# Patient Record
Sex: Female | Born: 1960 | Race: White | Hispanic: No | Marital: Single | Smoking: Never smoker
Health system: Southern US, Community
[De-identification: ages and names within clinical notes are randomized; demographics above are authoritative.]

## PROBLEM LIST (undated history)

## (undated) DIAGNOSIS — E039 Hypothyroidism, unspecified: Secondary | ICD-10-CM

## (undated) HISTORY — PX: TONSILLECTOMY: SUR1361

## (undated) HISTORY — DX: Hypothyroidism, unspecified: E03.9

---

## 1999-09-24 ENCOUNTER — Other Ambulatory Visit: Admission: RE | Admit: 1999-09-24 | Discharge: 1999-09-24 | Payer: Self-pay | Admitting: Obstetrics and Gynecology

## 1999-12-16 ENCOUNTER — Other Ambulatory Visit: Admission: RE | Admit: 1999-12-16 | Discharge: 1999-12-16 | Payer: Self-pay | Admitting: Obstetrics and Gynecology

## 2000-03-08 ENCOUNTER — Other Ambulatory Visit: Admission: RE | Admit: 2000-03-08 | Discharge: 2000-03-08 | Payer: Self-pay | Admitting: Obstetrics and Gynecology

## 2000-09-01 ENCOUNTER — Encounter: Admission: RE | Admit: 2000-09-01 | Discharge: 2000-09-01 | Payer: Self-pay | Admitting: General Surgery

## 2000-09-01 ENCOUNTER — Other Ambulatory Visit: Admission: RE | Admit: 2000-09-01 | Discharge: 2000-09-01 | Payer: Self-pay | Admitting: Obstetrics and Gynecology

## 2000-09-01 ENCOUNTER — Encounter: Payer: Self-pay | Admitting: General Surgery

## 2000-09-01 ENCOUNTER — Other Ambulatory Visit: Admission: RE | Admit: 2000-09-01 | Discharge: 2000-09-01 | Payer: Self-pay | Admitting: General Surgery

## 2000-09-01 ENCOUNTER — Encounter (INDEPENDENT_AMBULATORY_CARE_PROVIDER_SITE_OTHER): Payer: Self-pay | Admitting: *Deleted

## 2002-03-14 ENCOUNTER — Other Ambulatory Visit: Admission: RE | Admit: 2002-03-14 | Discharge: 2002-03-14 | Payer: Self-pay | Admitting: Obstetrics and Gynecology

## 2005-07-15 ENCOUNTER — Ambulatory Visit (HOSPITAL_COMMUNITY): Admission: RE | Admit: 2005-07-15 | Discharge: 2005-07-15 | Payer: Self-pay | Admitting: Obstetrics and Gynecology

## 2005-07-29 ENCOUNTER — Encounter (INDEPENDENT_AMBULATORY_CARE_PROVIDER_SITE_OTHER): Payer: Self-pay | Admitting: Specialist

## 2005-07-29 ENCOUNTER — Encounter: Admission: RE | Admit: 2005-07-29 | Discharge: 2005-07-29 | Payer: Self-pay | Admitting: Obstetrics and Gynecology

## 2005-07-29 ENCOUNTER — Other Ambulatory Visit: Admission: RE | Admit: 2005-07-29 | Discharge: 2005-07-29 | Payer: Self-pay | Admitting: Interventional Radiology

## 2006-01-06 ENCOUNTER — Encounter: Admission: RE | Admit: 2006-01-06 | Discharge: 2006-01-06 | Payer: Self-pay | Admitting: "Endocrinology

## 2007-04-20 ENCOUNTER — Encounter: Admission: RE | Admit: 2007-04-20 | Discharge: 2007-04-20 | Payer: Self-pay | Admitting: Obstetrics and Gynecology

## 2008-06-20 ENCOUNTER — Encounter: Admission: RE | Admit: 2008-06-20 | Discharge: 2008-06-20 | Payer: Self-pay | Admitting: Obstetrics and Gynecology

## 2009-08-07 ENCOUNTER — Encounter: Admission: RE | Admit: 2009-08-07 | Discharge: 2009-08-07 | Payer: Self-pay | Admitting: Obstetrics and Gynecology

## 2010-08-13 ENCOUNTER — Encounter: Admission: RE | Admit: 2010-08-13 | Discharge: 2010-08-13 | Payer: Self-pay | Admitting: Obstetrics and Gynecology

## 2011-07-13 ENCOUNTER — Other Ambulatory Visit: Payer: Self-pay | Admitting: Obstetrics and Gynecology

## 2011-07-13 DIAGNOSIS — Z1231 Encounter for screening mammogram for malignant neoplasm of breast: Secondary | ICD-10-CM

## 2011-09-23 ENCOUNTER — Ambulatory Visit
Admission: RE | Admit: 2011-09-23 | Discharge: 2011-09-23 | Disposition: A | Discharge status: Home / Self Care | Payer: BC Managed Care – PPO | Source: Ambulatory Visit | Attending: Obstetrics and Gynecology | Admitting: Obstetrics and Gynecology

## 2011-09-23 DIAGNOSIS — Z1231 Encounter for screening mammogram for malignant neoplasm of breast: Secondary | ICD-10-CM

## 2012-04-22 ENCOUNTER — Other Ambulatory Visit: Payer: Self-pay | Admitting: Obstetrics and Gynecology

## 2012-04-22 NOTE — Telephone Encounter (Signed)
Spoke with pt rgd rx refill request infromed pt need aex pt has appt 07/06/12 at 920 wit Sr informed pt will give refill to aex per protocol pt voice unserstanding

## 2012-05-25 ENCOUNTER — Telehealth: Payer: Self-pay | Admitting: Obstetrics and Gynecology

## 2012-05-25 NOTE — Telephone Encounter (Signed)
Triage/2nd req/rx

## 2012-05-25 NOTE — Telephone Encounter (Signed)
Lm for pt to call back

## 2012-07-06 ENCOUNTER — Ambulatory Visit: Payer: Self-pay | Admitting: Obstetrics and Gynecology

## 2012-08-02 ENCOUNTER — Ambulatory Visit: Payer: Self-pay | Admitting: Obstetrics and Gynecology

## 2012-08-17 ENCOUNTER — Encounter: Payer: Self-pay | Admitting: Obstetrics and Gynecology

## 2012-08-17 ENCOUNTER — Ambulatory Visit (INDEPENDENT_AMBULATORY_CARE_PROVIDER_SITE_OTHER): Payer: BC Managed Care – PPO | Admitting: Obstetrics and Gynecology

## 2012-08-17 VITALS — BP 110/74 | Ht 68.0 in | Wt 194.0 lb

## 2012-08-17 DIAGNOSIS — Z01419 Encounter for gynecological examination (general) (routine) without abnormal findings: Secondary | ICD-10-CM

## 2012-08-17 DIAGNOSIS — N951 Menopausal and female climacteric states: Secondary | ICD-10-CM

## 2012-08-17 DIAGNOSIS — Z124 Encounter for screening for malignant neoplasm of cervix: Secondary | ICD-10-CM

## 2012-08-17 MED ORDER — ETONOGESTREL-ETHINYL ESTRADIOL 0.12-0.015 MG/24HR VA RING: VAGINAL_RING | VAGINAL | Status: AC

## 2012-08-17 MED ORDER — NYSTATIN-TRIAMCINOLONE 100000-0.1 UNIT/GM-% EX OINT
TOPICAL_OINTMENT | Freq: Three times a day (TID) | CUTANEOUS | Status: AC | PRN
Start: 2012-08-17 — End: ?

## 2012-08-17 NOTE — Progress Notes (Signed)
The patient reports:normal menses, no abnormal bleeding, pelvic pain or discharge  Contraception:NuvaRing vaginal inserts  Last mammogram: was normal October 2012 Last pap: was normal May  2012  GC/Chlamydia cultures offered: declined HIV/RPR/HbsAg offered:  declined HSV 1 and 2 glycoprotein offered: declined  Menstrual cycle regular and monthly: Yes Menstrual flow normal: Yes  Urinary symptoms: none Normal bowel movements: Yes Reports abuse at home: No:  Subjective:    Beth Hill is a 51 y.o. female, No obstetric history on file., who presents for an annual exam.     History   Social History  . Marital Status: Single    Spouse Name: N/A    Number of Children: N/A  . Years of Education: N/A   Social History Main Topics  . Smoking status: Never Smoker   . Smokeless tobacco: Never Used  . Alcohol Use: No  . Drug Use: No  . Sexually Active: Yes    Birth Control/ Protection: Inserts     nuvaring    Other Topics Concern  . None   Social History Narrative  . None    Menstrual cycle:   LMP: Patient's last menstrual period was 07/24/2012.           Cycle: normal  The following portions of the patient's history were reviewed and updated as appropriate: allergies, current medications, past family history, past medical history, past social history, past surgical history and problem list.  Review of Systems Pertinent items are noted in HPI. Breast:Negative for breast lump,nipple discharge or nipple retraction Gastrointestinal: Negative for abdominal pain, change in bowel habits or rectal bleeding Urinary:negative   Objective:    BP 110/74  Ht 5\' 8"  (1.727 m)  Wt 194 lb (87.998 kg)  BMI 29.50 kg/m2  LMP 07/24/2012    Weight:  Wt Readings from Last 1 Encounters:  08/17/12 194 lb (87.998 kg)          BMI: Body mass index is 29.50 kg/(m^2).  General Appearance: Alert, appropriate appearance for age. No acute distress HEENT: Grossly normal Neck / Thyroid:  Supple, no masses, nodes or enlargement Lungs: clear to auscultation bilaterally Back: No CVA tenderness Breast Exam: No masses or nodes.No dimpling, nipple retraction or discharge. Cardiovascular: Regular rate and rhythm. S1, S2, no murmur Gastrointestinal: Soft, non-tender, no masses or organomegaly Pelvic Exam: Vulva and vagina appear normal. Bimanual exam reveals normal uterus and adnexa. Rectovaginal: normal rectal, no masses Lymphatic Exam: Non-palpable nodes in neck, clavicular, axillary, or inguinal regions Skin: no rash or abnormalities Neurologic: Normal gait and speech, no tremor  Psychiatric: Alert and oriented, appropriate affect.    Assessment:    Normal gyn exam Contraceptive management    Plan:    mammogram pap smear return annually or prn STD screening: declined Contraception:NuvaRing vaginal inserts  Will check Day 6 FSH if elevated, will d/c Nuvaring      Freida Busman JMD

## 2012-08-18 ENCOUNTER — Other Ambulatory Visit: Payer: Self-pay | Admitting: Obstetrics and Gynecology

## 2012-08-19 LAB — PAP IG W/ RFLX HPV ASCU

## 2012-08-25 ENCOUNTER — Other Ambulatory Visit: Payer: BC Managed Care – PPO

## 2012-08-26 ENCOUNTER — Other Ambulatory Visit: Payer: BC Managed Care – PPO

## 2012-08-26 DIAGNOSIS — N951 Menopausal and female climacteric states: Secondary | ICD-10-CM

## 2012-09-14 ENCOUNTER — Telehealth: Payer: Self-pay | Admitting: Obstetrics and Gynecology

## 2012-09-14 NOTE — Telephone Encounter (Signed)
TC to pt.  Requesting lab results.  Informed FSH reviewed by DR SR and no further F/U indicated.

## 2012-10-18 ENCOUNTER — Other Ambulatory Visit: Payer: Self-pay | Admitting: Obstetrics and Gynecology

## 2012-10-18 DIAGNOSIS — Z803 Family history of malignant neoplasm of breast: Secondary | ICD-10-CM

## 2012-10-18 DIAGNOSIS — Z1231 Encounter for screening mammogram for malignant neoplasm of breast: Secondary | ICD-10-CM

## 2012-12-28 ENCOUNTER — Ambulatory Visit: Payer: BC Managed Care – PPO

## 2013-01-11 ENCOUNTER — Ambulatory Visit: Payer: BC Managed Care – PPO

## 2013-01-18 ENCOUNTER — Ambulatory Visit
Admission: RE | Admit: 2013-01-18 | Discharge: 2013-01-18 | Disposition: A | Discharge status: Home / Self Care | Payer: BC Managed Care – PPO | Source: Ambulatory Visit | Attending: Obstetrics and Gynecology | Admitting: Obstetrics and Gynecology

## 2013-01-18 DIAGNOSIS — Z1231 Encounter for screening mammogram for malignant neoplasm of breast: Secondary | ICD-10-CM

## 2013-01-18 DIAGNOSIS — Z803 Family history of malignant neoplasm of breast: Secondary | ICD-10-CM

## 2014-04-11 ENCOUNTER — Other Ambulatory Visit: Payer: Self-pay

## 2014-04-11 DIAGNOSIS — Z1231 Encounter for screening mammogram for malignant neoplasm of breast: Secondary | ICD-10-CM

## 2014-05-07 ENCOUNTER — Encounter (INDEPENDENT_AMBULATORY_CARE_PROVIDER_SITE_OTHER): Payer: Self-pay

## 2014-05-07 ENCOUNTER — Ambulatory Visit
Admission: RE | Admit: 2014-05-07 | Discharge: 2014-05-07 | Disposition: A | Discharge status: Home / Self Care | Payer: BC Managed Care – PPO | Source: Ambulatory Visit

## 2014-05-07 DIAGNOSIS — Z1231 Encounter for screening mammogram for malignant neoplasm of breast: Secondary | ICD-10-CM

## 2015-05-08 ENCOUNTER — Other Ambulatory Visit: Payer: Self-pay

## 2015-05-08 DIAGNOSIS — Z1231 Encounter for screening mammogram for malignant neoplasm of breast: Secondary | ICD-10-CM

## 2015-06-05 ENCOUNTER — Ambulatory Visit
Admission: RE | Admit: 2015-06-05 | Discharge: 2015-06-05 | Disposition: A | Discharge status: Home / Self Care | Payer: BLUE CROSS/BLUE SHIELD | Source: Ambulatory Visit

## 2015-06-05 DIAGNOSIS — Z1231 Encounter for screening mammogram for malignant neoplasm of breast: Secondary | ICD-10-CM

## 2015-06-06 ENCOUNTER — Other Ambulatory Visit: Payer: Self-pay | Admitting: Obstetrics and Gynecology

## 2015-06-06 DIAGNOSIS — R928 Other abnormal and inconclusive findings on diagnostic imaging of breast: Secondary | ICD-10-CM

## 2015-06-12 ENCOUNTER — Ambulatory Visit
Admission: RE | Admit: 2015-06-12 | Discharge: 2015-06-12 | Disposition: A | Discharge status: Home / Self Care | Payer: BLUE CROSS/BLUE SHIELD | Source: Ambulatory Visit | Attending: Obstetrics and Gynecology | Admitting: Obstetrics and Gynecology

## 2015-06-12 DIAGNOSIS — R928 Other abnormal and inconclusive findings on diagnostic imaging of breast: Secondary | ICD-10-CM

## 2015-12-16 ENCOUNTER — Other Ambulatory Visit: Payer: Self-pay | Admitting: Obstetrics and Gynecology

## 2015-12-16 DIAGNOSIS — N63 Unspecified lump in unspecified breast: Secondary | ICD-10-CM

## 2016-01-22 ENCOUNTER — Ambulatory Visit
Admission: RE | Admit: 2016-01-22 | Discharge: 2016-01-22 | Disposition: A | Discharge status: Home / Self Care | Payer: BLUE CROSS/BLUE SHIELD | Source: Ambulatory Visit | Attending: Obstetrics and Gynecology | Admitting: Obstetrics and Gynecology

## 2016-01-22 DIAGNOSIS — N63 Unspecified lump in unspecified breast: Secondary | ICD-10-CM

## 2016-06-11 ENCOUNTER — Other Ambulatory Visit: Payer: Self-pay | Admitting: *Deleted

## 2016-06-11 ENCOUNTER — Encounter: Payer: Self-pay | Admitting: Vascular Surgery

## 2016-06-11 DIAGNOSIS — I83893 Varicose veins of bilateral lower extremities with other complications: Secondary | ICD-10-CM

## 2016-06-15 ENCOUNTER — Ambulatory Visit (HOSPITAL_COMMUNITY)
Admission: RE | Admit: 2016-06-15 | Discharge: 2016-06-15 | Disposition: A | Discharge status: Home / Self Care | Payer: BLUE CROSS/BLUE SHIELD | Source: Ambulatory Visit | Attending: Vascular Surgery | Admitting: Vascular Surgery

## 2016-06-15 ENCOUNTER — Ambulatory Visit (INDEPENDENT_AMBULATORY_CARE_PROVIDER_SITE_OTHER): Payer: BLUE CROSS/BLUE SHIELD | Admitting: Vascular Surgery

## 2016-06-15 VITALS — BP 113/76 | HR 77 | Temp 97.6°F | Resp 16 | Ht 67.5 in | Wt 170.0 lb

## 2016-06-15 DIAGNOSIS — I8393 Asymptomatic varicose veins of bilateral lower extremities: Secondary | ICD-10-CM | POA: Diagnosis not present

## 2016-06-15 DIAGNOSIS — I8391 Asymptomatic varicose veins of right lower extremity: Secondary | ICD-10-CM | POA: Diagnosis not present

## 2016-06-15 DIAGNOSIS — I8390 Asymptomatic varicose veins of unspecified lower extremity: Secondary | ICD-10-CM | POA: Insufficient documentation

## 2016-06-15 DIAGNOSIS — I83893 Varicose veins of bilateral lower extremities with other complications: Secondary | ICD-10-CM

## 2016-06-15 NOTE — Progress Notes (Signed)
Subjective:     Patient ID: Beth Hill, female   DOB: [birth year], 55 y.o.   MRN: [id number]  HPI this 55 year old female was self-referred for pain in both lower extremities and a history of sclerotherapy in both lower extremities for spider veins. She denies any history of DVT thrombophlebitis stasis ulcers or bleeding. She denies any bulging varicosities at this time. She does state that her legs ache and throb as the day progresses. 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 She has tried short leg elastic compression stockings with some help.  Past Medical History  Diagnosis Date  . Hypothyroid     Social History  Substance Use Topics  . Smoking status: Never Smoker   . Smokeless tobacco: Never Used  . Alcohol Use: No    Family History  Problem Relation Age of Onset  . Breast cancer Mother   . Lung cancer Mother   . Cancer Mother 51    bone  . Brain cancer Mother   . Thyroid cancer Brother 19  . Cancer Maternal Aunt     vulvar cancer  . Alzheimer's disease Maternal Grandmother   . Emphysema Maternal Grandfather   . Cancer Paternal Grandmother     bone cancer  . Heart failure Paternal Grandfather     No Known Allergies   Current outpatient prescriptions:  .  ALPRAZolam (XANAX) 0.5 MG tablet, Take 0.5 mg by mouth at bedtime as needed., Disp: , Rfl:  .  etonogestrel-ethinyl estradiol (NUVARING) 0.12-0.015 MG/24HR vaginal ring, Insert vaginally and leave in place for 3 consecutive weeks, then remove for 1 week., Disp: 1 each, Rfl: 11 .  ipratropium (ATROVENT) 0.03 % nasal spray, Place 1 spray into both nostrils as needed for rhinitis., Disp: , Rfl:  .  levothyroxine (SYNTHROID, LEVOTHROID) 100 MCG tablet, Take 100 mcg by mouth daily., Disp: , Rfl:  .  liothyronine (CYTOMEL) 5 MCG tablet, Take 5 mcg by mouth daily., Disp: , Rfl:  .  nystatin-triamcinolone ointment (MYCOLOG), Apply topically 3 (three) times daily as needed., Disp: 60 g, Rfl:  3  Filed Vitals:   06/15/16 1007  BP: 113/76  Pulse: 77  Temp: 97.6 F (36.4 C)  Resp: 16  Height: 5' 7.5" (1.715 m)  Weight: 170 lb (77.111 kg)  SpO2: 100%    Body mass index is 26.22 kg/(m^2).           Review of Systems denies chest pain, dyspnea on exertion, does have occasional shortness of breath due to allergies but not on a consistent basis. Denies hemoptysis or claudication.     Objective:   Physical Exam BP 113/76 mmHg  Pulse 77  Temp(Src) 97.6 F (36.4 C)  Resp 16  Ht 5' 7.5" (1.715 m)  Wt 170 lb (77.111 kg)  BMI 26.22 kg/m2  SpO2 100%    Gen.-alert and oriented x3 in no apparent distress HEENT normal for age Lungs no rhonchi or wheezing Cardiovascular regular rhythm no murmurs carotid pulses 3+ palpable no bruits audible Abdomen soft nontender no palpable masses Musculoskeletal free of  major deformities Skin clear -no rashes Neurologic normal Lower extremities 3+ femoral and dorsalis pedis pulses palpable bilaterally with no edema No bulging varicosities noted. Evidence of some small spider veins in the lateral thighs bilaterally.  Today I ordered bilateral venous duplex exam which I reviewed and interpreted. There is no DVT. There is gross reflux in the right great saphenous vein with borderline in  size vein down to the knee. Left great saphenous vein has no reflux.      Assessment:     Bilateral leg discomfort with small spider veins and a history of previous foam sclerotherapy at another location Mild gross reflux right great saphenous vein with borderline caliber vein and no gross reflux left great saphenous vein Symptoms are bilateral     Plan:     Would not recommend any intervention in her venous system at this time. If she develops bulging varicosities swelling or other symptoms in the right leg will need to repeat venous duplex of the right leg since it does have gross reflux in the right great saphenous system with a borderline  size vein Return to see us on when necessary basis Have recommended she wear elastic compression stockings while working

## 2016-06-29 ENCOUNTER — Other Ambulatory Visit: Payer: Self-pay | Admitting: Oncology

## 2016-06-29 DIAGNOSIS — N6320 Unspecified lump in the left breast, unspecified quadrant: Secondary | ICD-10-CM

## 2016-07-29 ENCOUNTER — Other Ambulatory Visit: Payer: BLUE CROSS/BLUE SHIELD

## 2016-09-02 ENCOUNTER — Ambulatory Visit
Admission: RE | Admit: 2016-09-02 | Discharge: 2016-09-02 | Disposition: A | Discharge status: Home / Self Care | Payer: BLUE CROSS/BLUE SHIELD | Source: Ambulatory Visit | Attending: Oncology | Admitting: Oncology

## 2016-09-02 DIAGNOSIS — N6320 Unspecified lump in the left breast, unspecified quadrant: Secondary | ICD-10-CM

## 2017-03-09 ENCOUNTER — Other Ambulatory Visit: Payer: Self-pay | Admitting: Sports Medicine

## 2017-03-09 DIAGNOSIS — M7062 Trochanteric bursitis, left hip: Secondary | ICD-10-CM

## 2017-03-17 ENCOUNTER — Other Ambulatory Visit: Payer: BLUE CROSS/BLUE SHIELD

## 2017-09-20 ENCOUNTER — Other Ambulatory Visit: Payer: Self-pay | Admitting: Obstetrics and Gynecology

## 2017-09-20 DIAGNOSIS — N63 Unspecified lump in unspecified breast: Secondary | ICD-10-CM

## 2017-12-29 ENCOUNTER — Ambulatory Visit
Admission: RE | Admit: 2017-12-29 | Discharge: 2017-12-29 | Disposition: A | Discharge status: Home / Self Care | Payer: BLUE CROSS/BLUE SHIELD | Source: Ambulatory Visit | Attending: Obstetrics and Gynecology | Admitting: Obstetrics and Gynecology

## 2017-12-29 DIAGNOSIS — N63 Unspecified lump in unspecified breast: Secondary | ICD-10-CM

## 2018-09-01 ENCOUNTER — Other Ambulatory Visit: Payer: Self-pay | Admitting: Sports Medicine

## 2018-09-01 DIAGNOSIS — M79672 Pain in left foot: Secondary | ICD-10-CM

## 2018-09-02 ENCOUNTER — Other Ambulatory Visit: Payer: BLUE CROSS/BLUE SHIELD

## 2018-11-28 ENCOUNTER — Other Ambulatory Visit: Payer: Self-pay | Admitting: Obstetrics and Gynecology

## 2018-11-28 DIAGNOSIS — Z1231 Encounter for screening mammogram for malignant neoplasm of breast: Secondary | ICD-10-CM

## 2019-01-04 ENCOUNTER — Ambulatory Visit: Payer: BLUE CROSS/BLUE SHIELD

## 2019-04-22 IMAGING — MG 2D DIGITAL DIAGNOSTIC BILATERAL MAMMOGRAM WITH CAD AND ADJUNCT T
8 of 13 series · 8 of 29 positions shown · non-contrast
Comparison: 09/02/2016 (bilateral), 01/22/2016 (left), 06/12/2015
(left), 06/05/2015 (bilateral) and earlier.

CLINICAL DATA: Approximate 2 1-1/2 year follow-up of a likely
benign asymmetry in the outer left breast at posterior depth,
possibly an intramammary lymph node, without sonographic
correlate.Annual evaluation, right breast.

EXAM:
2D DIGITAL DIAGNOSTIC BILATERAL MAMMOGRAM WITH CAD AND ADJUNCT TOMO

[R MLO (1 of 2)]
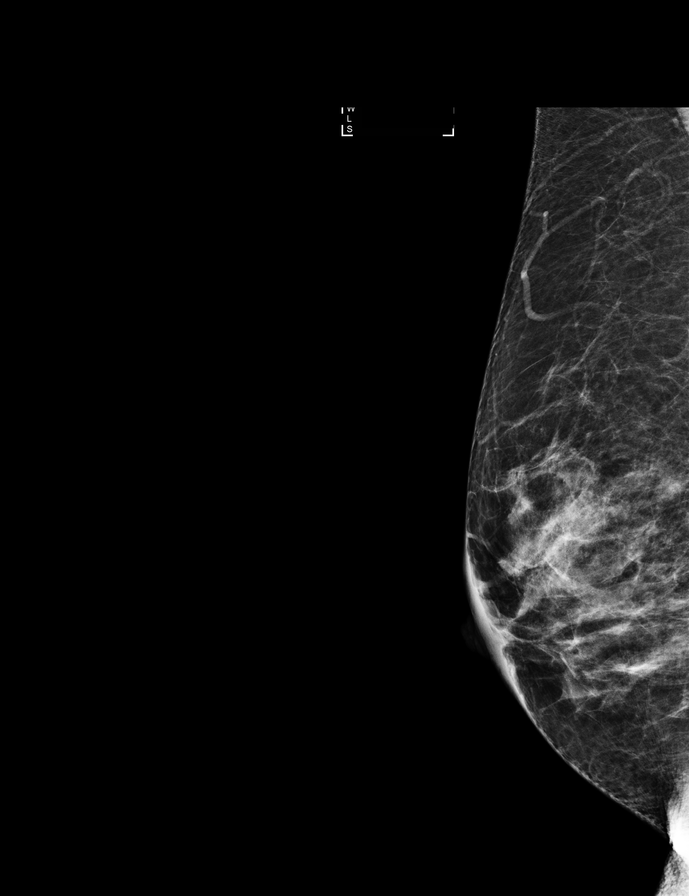

[L CC]
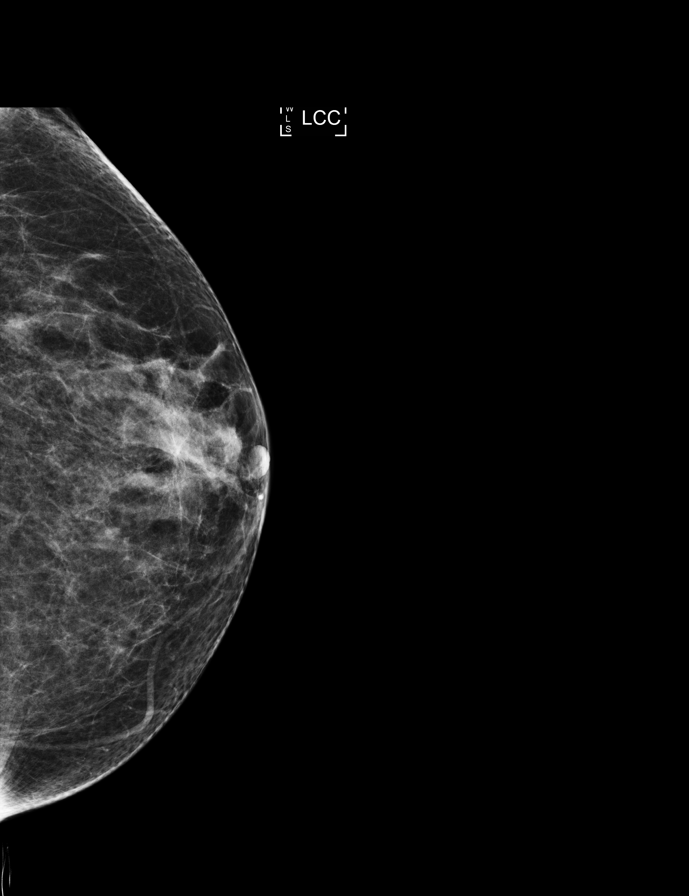

[R CC synth-2D]
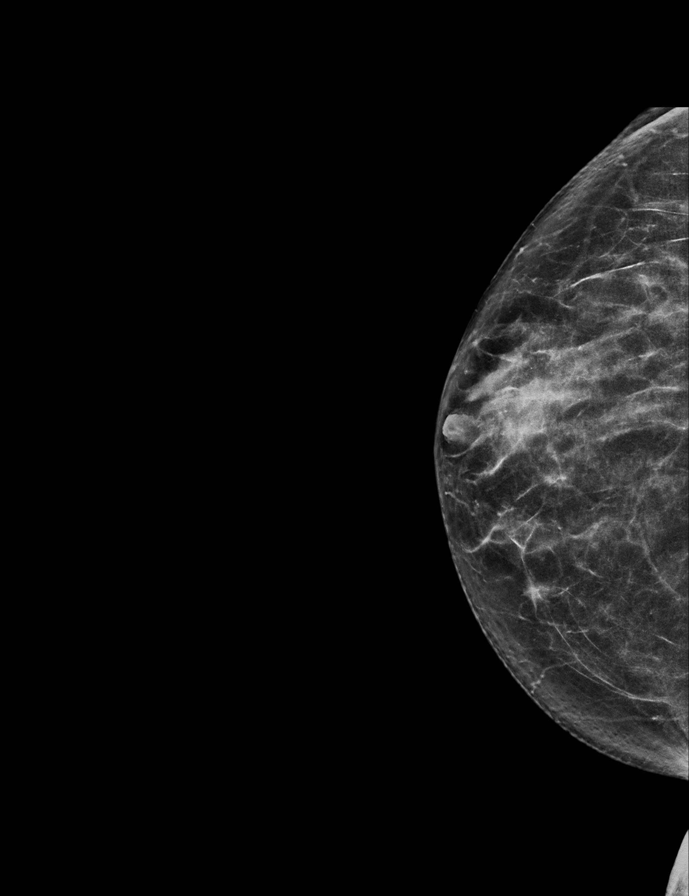

[R CC]
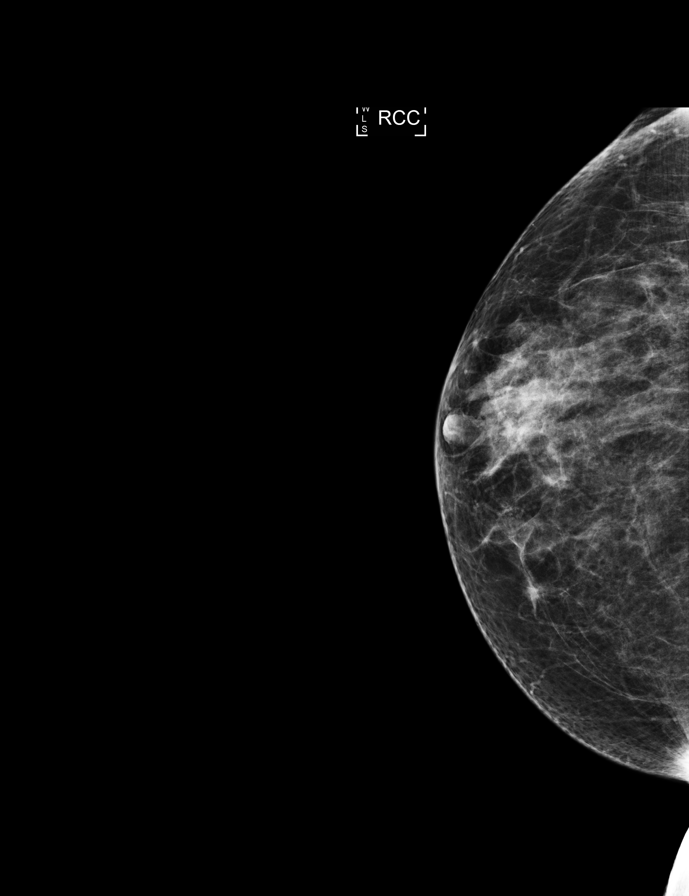

[L CC synth-2D]
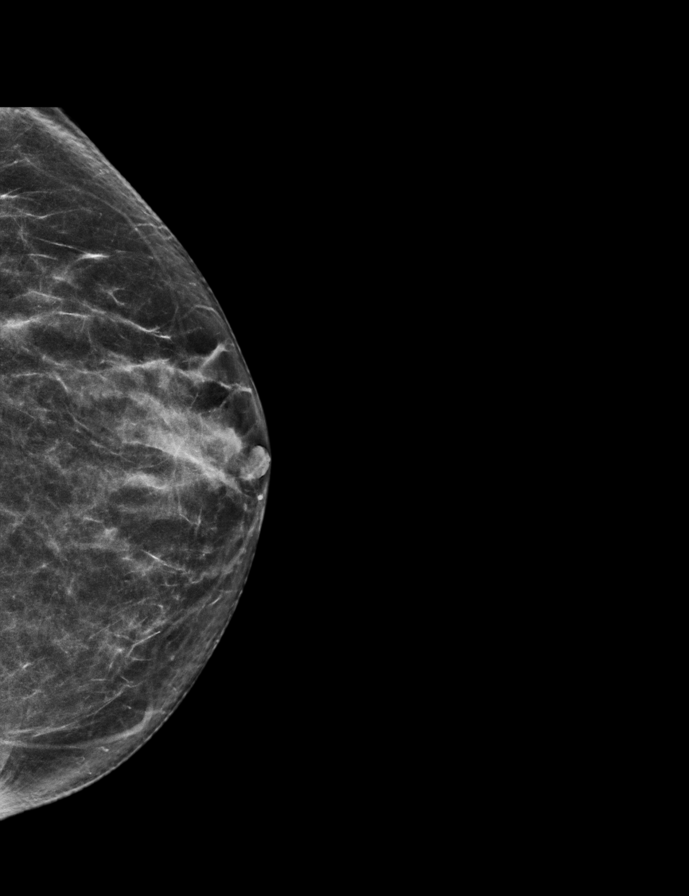

[L MLO]
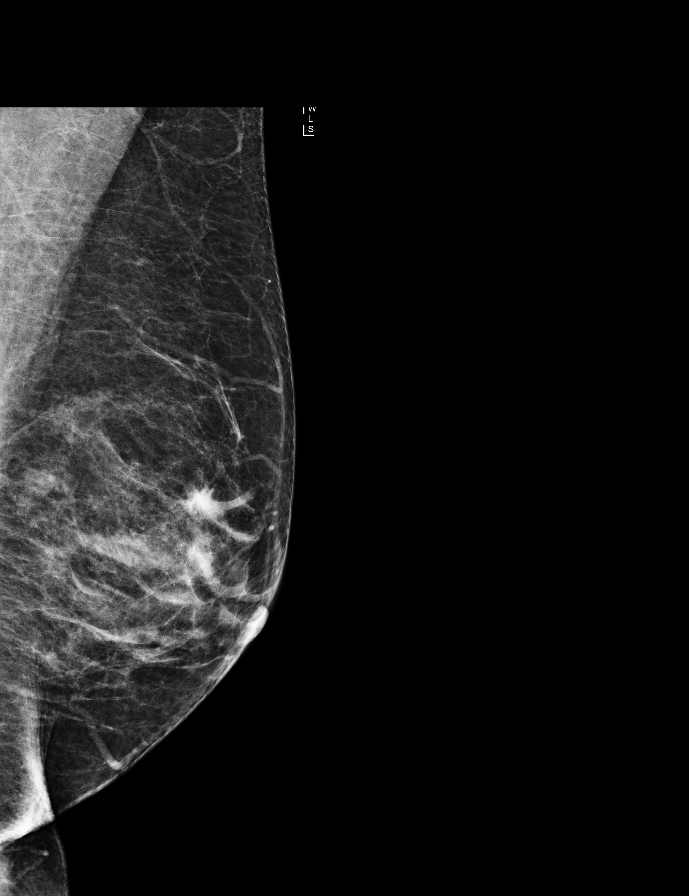

[L MLO synth-2D]
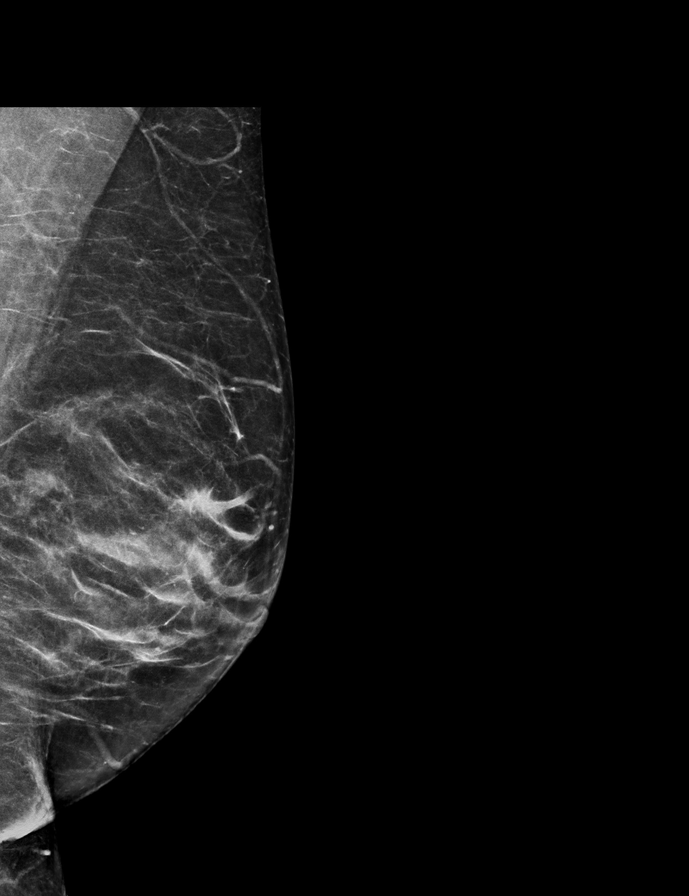

[R MLO (2 of 2)]
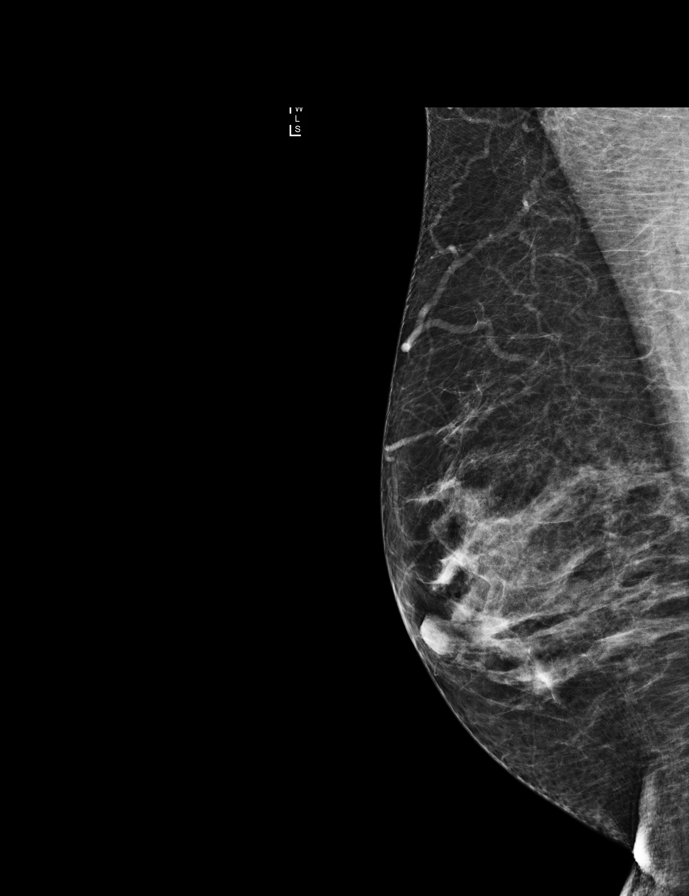

[8 of 29 positions shown; findings below may reference images not displayed]

ACR Breast Density Category c: The breast tissue is heterogeneously
dense, which may obscure small masses.
FINDINGS: Standard 2D and tomosynthesis full field CC and MLO views of both
breasts were obtained.

The focal asymmetry in the outer left breast at far posterior depth
is smaller than on the prior mammograms dating back to May 2015.
There is no associated architectural distortion or suspicious
calcifications. No new or suspicious findings in the right breast.

No findings suspicious for malignancy in the right breast.

Mammographic images were processed with CAD.
IMPRESSION: 1. No mammographic evidence of malignancy involving either breast.
2. The focal asymmetry involving the outer left breast at posterior
depth is smaller than on prior mammograms, indicating benignity.

RECOMMENDATION:
Screening mammogram in one year.(Code:[id number])

I have discussed the findings and recommendations with the patient.
Results were also provided in writing at the conclusion of the
visit. If applicable, a reminder letter will be sent to the patient
regarding the next appointment.

BI-RADS CATEGORY  2: Benign.

## 2020-02-02 ENCOUNTER — Ambulatory Visit: Payer: BC Managed Care – PPO | Attending: Internal Medicine

## 2020-02-02 DIAGNOSIS — Z23 Encounter for immunization: Secondary | ICD-10-CM | POA: Insufficient documentation

## 2020-02-02 NOTE — Progress Notes (Signed)
   Covid-19 Vaccination Clinic  Name:  Beth Hill    MRN: [id number] DOB: December 07, 1960  02/02/2020  [name] was observed post Covid-19 immunization for 15 minutes without incident. She was provided with Vaccine Information Sheet and instruction to access the V-Safe system.   [name] was instructed to call 911 with any severe reactions post vaccine: Marland Kitchen Difficulty breathing  . Swelling of face and throat  . A fast heartbeat  . A bad rash all over body  . Dizziness and weakness

## 2020-02-28 ENCOUNTER — Ambulatory Visit: Payer: BC Managed Care – PPO | Attending: Internal Medicine

## 2020-02-28 DIAGNOSIS — Z23 Encounter for immunization: Secondary | ICD-10-CM

## 2020-02-28 NOTE — Progress Notes (Signed)
   Covid-19 Vaccination Clinic  Name:  Teleah Villamar    MRN: [id number] DOB: 1961/01/11  02/28/2020  Ms. Yerian was observed post Covid-19 immunization for 15 minutes without incident. She was provided with Vaccine Information Sheet and instruction to access the V-Safe system.   Ms. Atilano was instructed to call 911 with any severe reactions post vaccine: Marland Kitchen Difficulty breathing  . Swelling of face and throat  . A fast heartbeat  . A bad rash all over body  . Dizziness and weakness   Immunizations Administered    Name Date Dose VIS Date Route   Pfizer COVID-19 Vaccine 02/28/2020 11:42 AM 0.3 mL 11/10/2019 Intramuscular   Manufacturer: [employer], [employer]   Lot: [id number]   NDC: [address]-4

## 2024-02-01 ENCOUNTER — Other Ambulatory Visit: Payer: Self-pay | Admitting: Obstetrics and Gynecology

## 2024-02-01 DIAGNOSIS — Z1231 Encounter for screening mammogram for malignant neoplasm of breast: Secondary | ICD-10-CM
